# Patient Record
Sex: Female | Born: 2000 | Race: Black or African American | Hispanic: No | Marital: Single | Smoking: Never smoker
Health system: Southern US, Community
[De-identification: ages and names within clinical notes are randomized; demographics above are authoritative.]

---
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
## 2018-02-05 ENCOUNTER — Encounter (HOSPITAL_COMMUNITY): Payer: Self-pay | Admitting: Emergency Medicine

## 2018-02-05 ENCOUNTER — Ambulatory Visit (HOSPITAL_COMMUNITY)
Admission: EM | Admit: 2018-02-05 | Discharge: 2018-02-05 | Disposition: A | Discharge status: Home / Self Care | Payer: Medicaid Other | Attending: Family Medicine | Admitting: Family Medicine

## 2018-02-05 DIAGNOSIS — M546 Pain in thoracic spine: Secondary | ICD-10-CM | POA: Diagnosis not present

## 2018-02-05 DIAGNOSIS — M545 Low back pain, unspecified: Secondary | ICD-10-CM

## 2018-02-05 MED ORDER — NAPROXEN 500 MG PO TABS: 500.0000 mg | ORAL_TABLET | Freq: Two times a day (BID) | ORAL | 0 refills | Status: AC
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
## 2019-07-05 ENCOUNTER — Emergency Department (HOSPITAL_COMMUNITY)
Admission: EM | Admit: 2019-07-05 | Discharge: 2019-07-05 | Disposition: A | Discharge status: Home / Self Care | Payer: Medicaid Other | Attending: Emergency Medicine | Admitting: Emergency Medicine

## 2019-07-05 ENCOUNTER — Emergency Department (HOSPITAL_COMMUNITY): Payer: Medicaid Other

## 2019-07-05 ENCOUNTER — Encounter (HOSPITAL_COMMUNITY): Payer: Self-pay | Admitting: Emergency Medicine

## 2019-07-05 ENCOUNTER — Other Ambulatory Visit: Payer: Self-pay

## 2019-07-05 DIAGNOSIS — Y9289 Other specified places as the place of occurrence of the external cause: Secondary | ICD-10-CM | POA: Diagnosis not present

## 2019-07-05 DIAGNOSIS — S8392XA Sprain of unspecified site of left knee, initial encounter: Secondary | ICD-10-CM | POA: Diagnosis not present

## 2019-07-05 DIAGNOSIS — X58XXXA Exposure to other specified factors, initial encounter: Secondary | ICD-10-CM | POA: Diagnosis not present

## 2019-07-05 DIAGNOSIS — S8992XA Unspecified injury of left lower leg, initial encounter: Secondary | ICD-10-CM | POA: Diagnosis present

## 2019-07-05 DIAGNOSIS — Y9389 Activity, other specified: Secondary | ICD-10-CM | POA: Diagnosis not present

## 2019-07-05 DIAGNOSIS — S86912A Strain of unspecified muscle(s) and tendon(s) at lower leg level, left leg, initial encounter: Secondary | ICD-10-CM

## 2019-07-05 DIAGNOSIS — Z79899 Other long term (current) drug therapy: Secondary | ICD-10-CM | POA: Diagnosis not present

## 2019-07-05 DIAGNOSIS — Y999 Unspecified external cause status: Secondary | ICD-10-CM | POA: Insufficient documentation

## 2019-07-05 NOTE — Discharge Instructions (Signed)
Use a knee sleeve to help with compression.  You can also ice and complete the range of motion exercises attached. You can alternate Tylenol and ibuprofen to help with pain and swelling. Follow-up with orthopedist listed below for further evaluation. Return to the ED if you start to develop worsening swelling, redness or warmth of the joint, injuries or falls.

## 2019-07-05 NOTE — ED Provider Notes (Signed)
[facility] EMERGENCY DEPARTMENT Provider Note   CSN: [id number] Arrival date & time: 07/05/19  1009     History Chief Complaint  Patient presents with  . Knee Pain    Sara Hammond is a 19 y.o. female who presents to ED with a chief complaint of left knee pain.  Since yesterday started having aching knee pain with mild swelling associated with it.  History of similar symptoms several years ago which resolved after a few days.  She has not taken any medications to help with her pain or swelling.  Denies any injuries or falls, history of septic joint, fever, redness, prior fracture, dislocations or procedures in the area.  HPI     History reviewed. No pertinent past medical history.  There are no problems to display for this patient.   History reviewed. No pertinent surgical history.   OB History   No obstetric history on file.     No family history on file.  Social History   Tobacco Use  . Smoking status: Never Smoker  Substance Use Topics  . Alcohol use: No  . Drug use: Not on file    Home Medications Prior to Admission medications   Medication Sig Start Date End Date Taking? Authorizing Provider  medroxyPROGESTERone (DEPO-PROVERA) 150 MG/ML injection Inject 150 mg into the muscle every 3 (three) months.  01/07/17   [provider]    Allergies    Patient has no known allergies.  Review of Systems   Review of Systems  Constitutional: Negative for chills and fever.  Musculoskeletal: Positive for arthralgias and joint swelling. Negative for myalgias.  Skin: Negative for color change and wound.    Physical Exam Updated Vital Signs BP (!) 146/82 (BP Location: Right Arm)   Pulse 89   Temp 98.5 F (36.9 C) (Oral)   Resp 16   SpO2 99%   Physical Exam Vitals and nursing note reviewed.  Constitutional:      General: She is not in acute distress.    Appearance: She is well-developed. She is not diaphoretic.  HENT:     Head:  Normocephalic and atraumatic.  Eyes:     General: No scleral icterus.    Conjunctiva/sclera: Conjunctivae normal.  Pulmonary:     Effort: Pulmonary effort is normal. No respiratory distress.  Musculoskeletal:        General: Tenderness present.     Cervical back: Normal range of motion.     Comments: Diffuse TTP of left anterior knee with minimal associated edema.  No erythema, warmth or changes to range of motion noted. 2+ DP pulses plated bilaterally.  No calf tenderness or erythema/edema extending into the leg.  Ambulatory.  Skin:    Findings: No rash.  Neurological:     Mental Status: She is alert.     ED Results / Procedures / Treatments   Labs (all labs ordered are listed, but only abnormal results are displayed) Labs Reviewed - No data to display  EKG None  Radiology DG Knee Complete 4 Views Left  Result Date: 07/05/2019 CLINICAL DATA:  Left knee pain. EXAM: LEFT KNEE - COMPLETE 4+ VIEW COMPARISON:  None. FINDINGS: No evidence of fracture, dislocation, or joint effusion. No evidence of arthropathy or other focal bone abnormality. Soft tissues are unremarkable. IMPRESSION: Negative. Electronically Signed   By: Kathreen Devoid   On: 07/05/2019 10:56    Procedures Procedures (including critical care time)  Medications Ordered in ED Medications - No data  to display  ED Course  I have reviewed the triage vital signs and the nursing notes.  Pertinent labs & imaging results that were available during my care of the patient were reviewed by me and considered in my medical decision making (see chart for details).    MDM Rules/Calculators/A&P                      19 year old female presents to ED for left knee pain since yesterday.  No known injuries or trauma.  History of similar symptoms in the past which resolved with time.  There is some tenderness and edema noted on exam without erythema, warmth of joint or changes to range of motion.  Areas neurovascularly intact.   Sensation intact to light touch of extremity.  No calf tenderness.  X-ray is negative.  Will have her take Tylenol and ibuprofen as needed, give knee sleeve and instructions for rice therapy. Will give ortho f/u. She is ambulatory here without difficulty.  Patient is hemodynamically stable, in NAD, and able to ambulate in the ED. Evaluation does not show pathology that would require ongoing emergent intervention or inpatient treatment. I explained the diagnosis to the patient. Pain has been managed and has no complaints prior to discharge. Patient is comfortable with above plan and is stable for discharge at this time. All questions were answered prior to disposition. Strict return precautions for returning to the ED were discussed. Encouraged follow up with PCP.   An After Visit Summary was printed and given to the patient.   Portions of this note were generated with Scientist, clinical (histocompatibility and immunogenetics). Dictation errors may occur despite best attempts at proofreading.  Final Clinical Impression(s) / ED Diagnoses Final diagnoses:  Strain of left knee, initial encounter    Rx / DC Orders ED Discharge Orders    None       Dietrich Pates, PA-C 07/05/19 1127    Virgina Norfolk, DO 07/05/19 1610

## 2019-07-05 NOTE — ED Triage Notes (Signed)
Pt reports L knee pain without trauma or injury. She states that she had pain a while back but it went away on its own, now reports pain returned yesterday, worse with walking or standing. Reports some swelling to knee.

## 2019-07-25 ENCOUNTER — Emergency Department (HOSPITAL_COMMUNITY)
Admission: EM | Admit: 2019-07-25 | Discharge: 2019-07-26 | Disposition: A | Discharge status: Home / Self Care | Payer: Medicaid Other | Attending: Emergency Medicine | Admitting: Emergency Medicine

## 2019-07-25 ENCOUNTER — Other Ambulatory Visit: Payer: Self-pay

## 2019-07-25 ENCOUNTER — Encounter (HOSPITAL_COMMUNITY): Payer: Self-pay | Admitting: Emergency Medicine

## 2019-07-25 DIAGNOSIS — Z79899 Other long term (current) drug therapy: Secondary | ICD-10-CM | POA: Diagnosis not present

## 2019-07-25 DIAGNOSIS — R1030 Lower abdominal pain, unspecified: Secondary | ICD-10-CM | POA: Diagnosis not present

## 2019-07-25 DIAGNOSIS — N939 Abnormal uterine and vaginal bleeding, unspecified: Secondary | ICD-10-CM | POA: Insufficient documentation

## 2019-07-25 LAB — BASIC METABOLIC PANEL
Anion gap: 8 (ref 5–15)
BUN: 11 mg/dL (ref 6–20)
CO2: 22 mmol/L (ref 22–32)
Calcium: 9.1 mg/dL (ref 8.9–10.3)
Chloride: 107 mmol/L (ref 98–111)
Creatinine, Ser: 0.74 mg/dL (ref 0.44–1.00)
GFR calc Af Amer: >60 mL/min (ref >60)
GFR calc non Af Amer: >60 mL/min (ref >60)
Glucose, Bld: 91 mg/dL (ref 70–99)
Potassium: 3.5 mmol/L (ref 3.5–5.1)
Sodium: 137 mmol/L (ref 135–145)

## 2019-07-25 LAB — I-STAT BETA HCG BLOOD, ED (MC, WL, AP ONLY): I-stat hCG, quantitative: <5 m[IU]/mL (ref <5)

## 2019-07-25 LAB — CBC WITH DIFFERENTIAL/PLATELET
Abs Immature Granulocytes: 0.01 10*3/uL (ref 0.00–0.07)
Basophils Absolute: 0 10*3/uL (ref 0.0–0.1)
Basophils Relative: 0 %
Eosinophils Absolute: 0 10*3/uL (ref 0.0–0.5)
Eosinophils Relative: 0 %
HCT: 41.6 % (ref 36.0–46.0)
Hemoglobin: 13.6 g/dL (ref 12.0–15.0)
Immature Granulocytes: 0 %
Lymphocytes Relative: 49 %
Lymphs Abs: 2.9 10*3/uL (ref 0.7–4.0)
MCH: 29 pg (ref 26.0–34.0)
MCHC: 32.7 g/dL (ref 30.0–36.0)
MCV: 88.7 fL (ref 80.0–100.0)
Monocytes Absolute: 0.5 10*3/uL (ref 0.1–1.0)
Monocytes Relative: 8 %
Neutro Abs: 2.6 10*3/uL (ref 1.7–7.7)
Neutrophils Relative %: 43 %
Platelets: 183 10*3/uL (ref 150–400)
RBC: 4.69 MIL/uL (ref 3.87–5.11)
RDW: 12.7 % (ref 11.5–15.5)
WBC: 6 10*3/uL (ref 4.0–10.5)
nRBC: 0 % (ref 0.0–0.2)

## 2019-07-25 LAB — URINALYSIS, ROUTINE W REFLEX MICROSCOPIC
Bacteria, UA: NONE SEEN
Bilirubin Urine: NEGATIVE
Glucose, UA: NEGATIVE mg/dL
Ketones, ur: 80 mg/dL — AB
Leukocytes,Ua: NEGATIVE
Nitrite: NEGATIVE
Protein, ur: NEGATIVE mg/dL
Specific Gravity, Urine: 1.02 (ref 1.005–1.030)
pH: 5 (ref 5.0–8.0)

## 2019-07-25 NOTE — ED Triage Notes (Signed)
Pt states yesterday she got a positive preg test and today she got another one negative, she states she started having abd pain with cramps today and heavy vaginal bleed. Will like to be check.

## 2019-07-26 MED ORDER — CEFTRIAXONE SODIUM 500 MG IJ SOLR
250.0000 mg | Freq: Once | INTRAMUSCULAR | Status: AC
  Administered 2019-07-26: 01:00:00 250 mg via INTRAMUSCULAR
  Filled 2019-07-26: qty 500

## 2019-07-26 MED ORDER — AZITHROMYCIN 250 MG PO TABS
1000.0000 mg | ORAL_TABLET | Freq: Once | ORAL | Status: AC
  Administered 2019-07-26: 01:00:00 1000 mg via ORAL
  Filled 2019-07-26: qty 4

## 2019-07-26 MED ORDER — IBUPROFEN 800 MG PO TABS
800.0000 mg | ORAL_TABLET | Freq: Once | ORAL | Status: AC
  Administered 2019-07-26: 01:00:00 800 mg via ORAL
  Filled 2019-07-26: qty 1

## 2019-07-26 MED ORDER — STERILE WATER FOR INJECTION IJ SOLN
INTRAMUSCULAR | Status: AC
  Administered 2019-07-26: 01:00:00 10 mL
  Filled 2019-07-26: qty 10

## 2019-07-26 NOTE — ED Notes (Signed)
Patient verbalizes understanding of discharge instructions. Opportunity for questioning and answers were provided.All questions answered completely.  Armband removed by staff, pt discharged from ED. Ambulatory with strong, steady gait 

## 2019-07-26 NOTE — ED Notes (Addendum)
Pt to [facility] from [facility]. PT A&OX4, in NAD, placed on continuous pulse ox and BP cuff. VSS. Pt reports she took a positive home pregnancy test yesterday. Reports her period was two weeks late. Pt reports she had a doctor's appointment this morning to receive her depo shot. States she had a negative pregnancy test at her MDs office. Pt reports she went to work when she started experiencing vaginal bleeding and abdominal cramping. Pt reports changing pads three times today. MD at bedside

## 2019-07-26 NOTE — Discharge Instructions (Addendum)
Pregnancy test was negative, all other labs were within normal limits. You have had the treatment for STDs.  Abstain from sex for at least 1 week to let it take effect. Take the prescribed medication as directed to help with cramping.  Can also use heating pad or warm compress. Follow-up with your OB-GYN as scheduled in the next few weeks. Return to the ED for new or worsening symptoms.

## 2019-07-26 NOTE — ED Notes (Signed)
Ibuprofen given per MAR. Name/DOB verified with pt 

## 2019-07-26 NOTE — ED Provider Notes (Signed)
[facility] EMERGENCY DEPARTMENT Provider Note   CSN: [id number] Arrival date & time: 07/25/19  2001     History Chief Complaint  Patient presents with  . Vaginal Bleeding    Sara Hammond is a 19 y.o. female.  The history is provided by the patient and medical records.    19 year old female with no significant past medical history presenting to the ED for vaginal bleeding.  States her menstrual cycle was due 2 weeks ago, to started having bleeding today.  Given the late cycle she was concerned of pregnancy so took 2 home pregnancy test, one was positive and the second was negative.  She was actually at her OB/GYN office this morning to get her Depo shot, but did not speak to her physician about this.  States she just wants to be "checked out".  She does report some lower abdominal cramping, feels like menstrual cramps but is slightly more intense.  She took 2 Motrin tablets around 2:30 PM today but has not tried any other medications since that time.  History reviewed. No pertinent past medical history.  There are no problems to display for this patient.   History reviewed. No pertinent surgical history.   OB History   No obstetric history on file.     No family history on file.  Social History   Tobacco Use  . Smoking status: Never Smoker  . Smokeless tobacco: Never Used  Substance Use Topics  . Alcohol use: No  . Drug use: Not on file    Home Medications Prior to Admission medications   Medication Sig Start Date End Date Taking? Authorizing Provider  medroxyPROGESTERone (DEPO-PROVERA) 150 MG/ML injection Inject 150 mg into the muscle every 3 (three) months.  01/07/17   [provider]    Allergies    Patient has no known allergies.  Review of Systems   Review of Systems  Genitourinary: Positive for vaginal bleeding.  All other systems reviewed and are negative.   Physical Exam Updated Vital Signs BP 117/88 (BP Location: Right  Arm)   Pulse 96   Temp 98.6 F (37 C) (Oral)   Resp 18   Ht 5\' 1"  (1.549 m)   Wt 69.9 kg   SpO2 100%   BMI 29.10 kg/m   Physical Exam Vitals and nursing note reviewed.  Constitutional:      Appearance: She is well-developed.  HENT:     Head: Normocephalic and atraumatic.  Eyes:     Conjunctiva/sclera: Conjunctivae normal.     Pupils: Pupils are equal, round, and reactive to light.  Cardiovascular:     Rate and Rhythm: Normal rate and regular rhythm.     Heart sounds: Normal heart sounds.  Pulmonary:     Effort: Pulmonary effort is normal. No respiratory distress.     Breath sounds: Normal breath sounds. No rhonchi.  Abdominal:     General: Bowel sounds are normal.     Palpations: Abdomen is soft.     Tenderness: There is no abdominal tenderness. There is no guarding.     Hernia: No hernia is present.     Comments: Soft, non-tender, no peritoneal signs  Musculoskeletal:        General: Normal range of motion.     Cervical back: Normal range of motion.  Skin:    General: Skin is warm and dry.  Neurological:     Mental Status: She is alert and oriented to person, place, and time.  ED Results / Procedures / Treatments   Labs (all labs ordered are listed, but only abnormal results are displayed) Labs Reviewed  URINALYSIS, ROUTINE W REFLEX MICROSCOPIC - Abnormal; Notable for the following components:      Result Value   APPearance HAZY (*)    Hgb urine dipstick LARGE (*)    Ketones, ur 80 (*)    All other components within normal limits  CBC WITH DIFFERENTIAL/PLATELET  BASIC METABOLIC PANEL  I-STAT BETA HCG BLOOD, ED ([facility], [facility], [facility] ONLY)    EKG None  Radiology No results found.  Procedures Procedures (including critical care time)  Medications Ordered in ED Medications - No data to display  ED Course  I have reviewed the triage vital signs and the nursing notes.  Pertinent labs & imaging results that were available during my care of the patient were  reviewed by me and considered in my medical decision making (see chart for details).    MDM Rules/Calculators/A&P  19 year old female presenting to the ED with vaginal bleeding.  She was concerned for possible pregnancy as her menstrual cycle was due 2 weeks ago.  She was actually seen by her OB/GYN today and given Depo injection but did not speak to her doctor about this.  She reports continued cramping of the lower abdomen, feels like menstrual cramping but more intense.  She is afebrile and nontoxic.  Abdomen is soft and benign.  Denies any irregular discharge or pelvic pain.  Pregnancy test negative, labs otherwise reassuring.  Symptoms today likely due to onset of her menses, unclear why it was late.  Per chart review, patient was supposed to pick up medications today for treatment of chlamydia that was diagnosed at prior GYN visit.  States she did go to the pharmacy but they told her "they did not have it".  Will give rocephin/azithromycin here.  Given lack of pelvic pain, fever, or leukocytosis here, doubt PID (she was actually unaware she had chlaymdia)  She has follow-up with GYN scheduled in 2 weeks for re-check.  Continue motrin 800mg  TID, heating pad, warm compress, etc for cramping.  She may return here for any new/acute changes.  Final Clinical Impression(s) / ED Diagnoses Final diagnoses:  Vaginal bleeding    Rx / DC Orders ED Discharge Orders    None       Larene Pickett, PA-C 07/26/19 0131    Orpah Greek, MD 07/26/19 [phone]

## 2019-07-26 NOTE — ED Notes (Signed)
abx given per [facility]. Name/DOB verified with pt. Pt remains resting on cart in NAD. VSS, breathing easy, non-labored

## 2020-06-20 IMAGING — CR DG KNEE COMPLETE 4+V*L*
4 series · 4 of 4 positions shown · non-contrast
Comparison: None.

CLINICAL DATA: Left knee pain.

EXAM:
LEFT KNEE - COMPLETE 4+ VIEW

[knee ap]
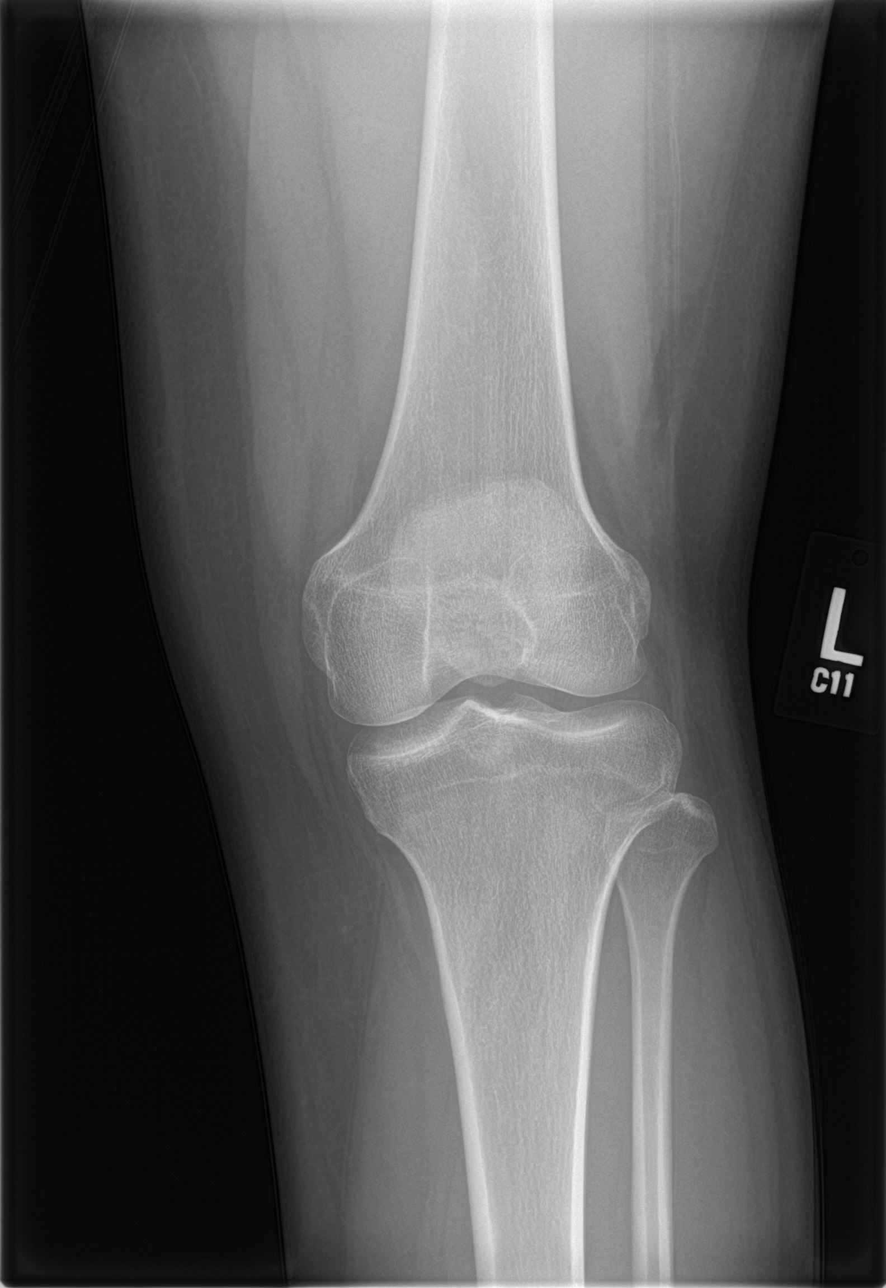

[knee lat]
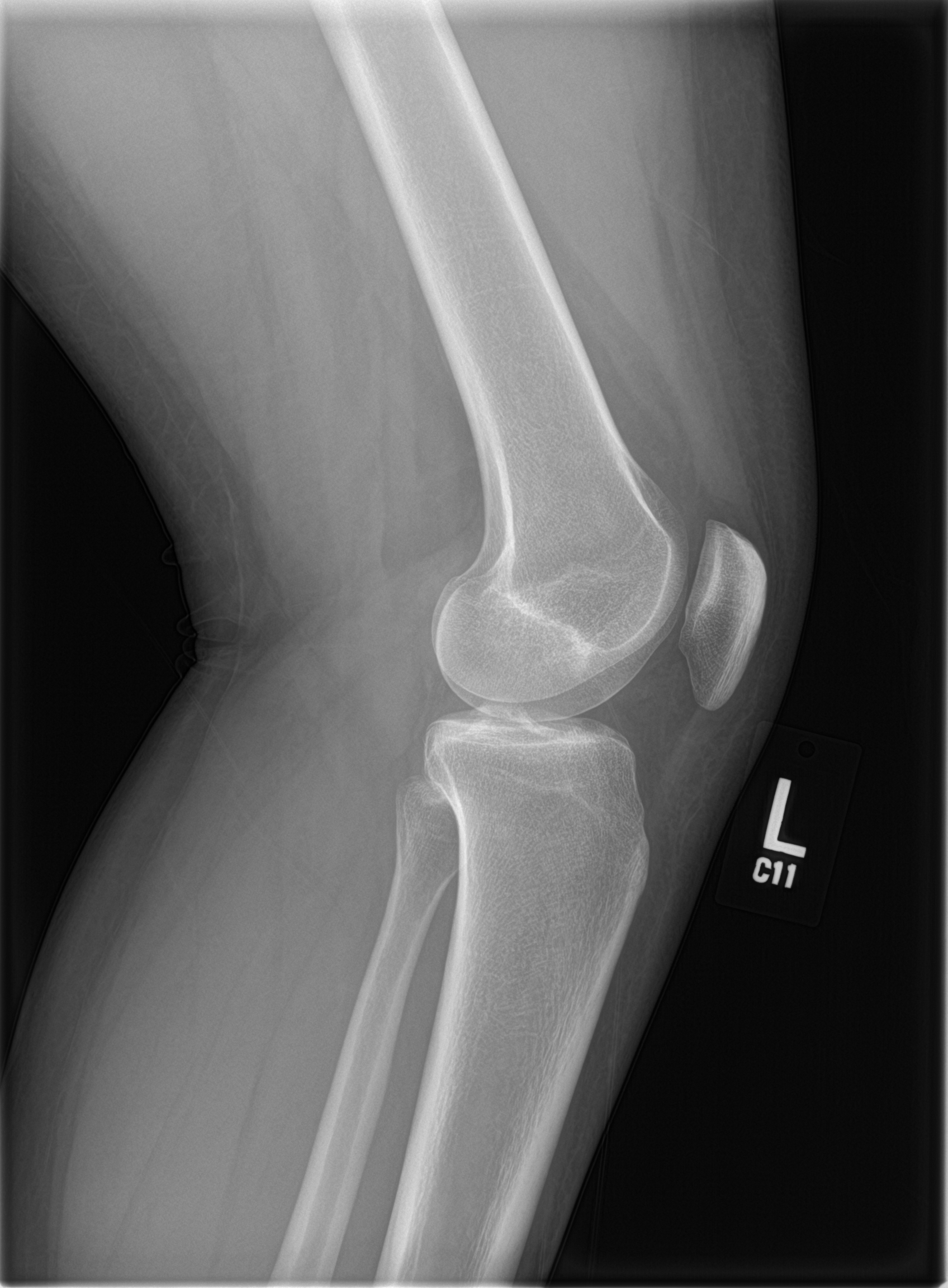

[knee obl (1 of 2)]
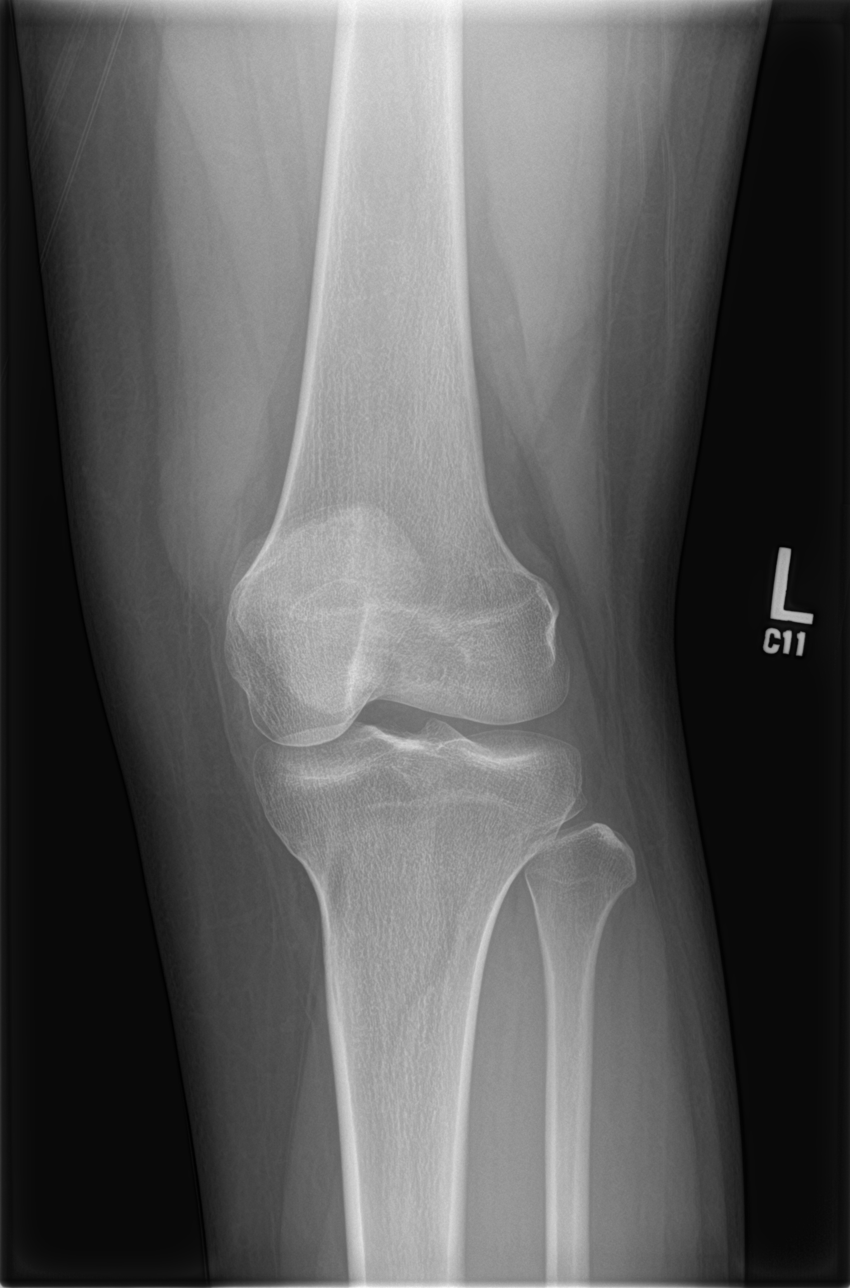

[knee obl (2 of 2)]
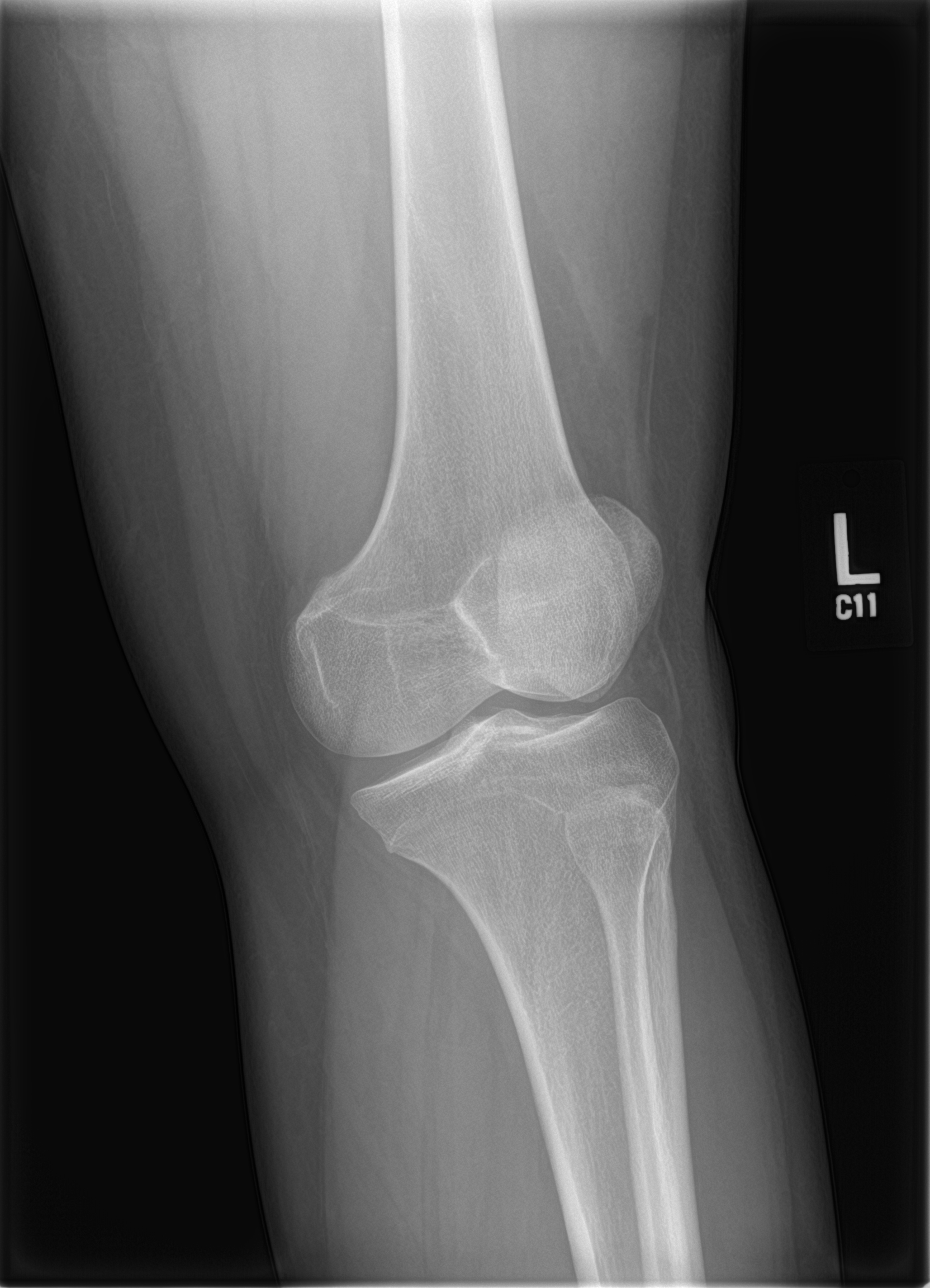

[4 of 4 positions shown; findings below may reference images not displayed]

FINDINGS: No evidence of fracture, dislocation, or joint effusion. No evidence
of arthropathy or other focal bone abnormality. Soft tissues are
unremarkable.
IMPRESSION: Negative.

## 2023-07-25 ENCOUNTER — Encounter (HOSPITAL_COMMUNITY): Payer: Self-pay | Admitting: *Deleted

## 2023-07-25 ENCOUNTER — Other Ambulatory Visit: Payer: Self-pay

## 2023-07-25 ENCOUNTER — Emergency Department (HOSPITAL_COMMUNITY)
Admission: EM | Admit: 2023-07-25 | Discharge: 2023-07-25 | Disposition: A | Discharge status: Home / Self Care | Payer: Medicaid Other | Attending: Emergency Medicine | Admitting: Emergency Medicine

## 2023-07-25 DIAGNOSIS — Z20822 Contact with and (suspected) exposure to covid-19: Secondary | ICD-10-CM | POA: Insufficient documentation

## 2023-07-25 DIAGNOSIS — J111 Influenza due to unidentified influenza virus with other respiratory manifestations: Secondary | ICD-10-CM | POA: Diagnosis not present

## 2023-07-25 DIAGNOSIS — R1033 Periumbilical pain: Secondary | ICD-10-CM | POA: Insufficient documentation

## 2023-07-25 DIAGNOSIS — R059 Cough, unspecified: Secondary | ICD-10-CM | POA: Diagnosis present

## 2023-07-25 LAB — COMPREHENSIVE METABOLIC PANEL
ALT: 18 U/L (ref 0–44)
AST: 27 U/L (ref 15–41)
Albumin: 3.7 g/dL (ref 3.5–5.0)
Alkaline Phosphatase: 76 U/L (ref 38–126)
Anion gap: 12 (ref 5–15)
BUN: 9 mg/dL (ref 6–20)
CO2: 24 mmol/L (ref 22–32)
Calcium: 8.8 mg/dL — ABNORMAL LOW (ref 8.9–10.3)
Chloride: 100 mmol/L (ref 98–111)
Creatinine, Ser: 0.85 mg/dL (ref 0.44–1.00)
GFR, Estimated: >60 mL/min (ref >60)
Glucose, Bld: 90 mg/dL (ref 70–99)
Potassium: 3.5 mmol/L (ref 3.5–5.1)
Sodium: 136 mmol/L (ref 135–145)
Total Bilirubin: 0.6 mg/dL (ref 0.0–1.2)
Total Protein: 7.9 g/dL (ref 6.5–8.1)

## 2023-07-25 LAB — RESP PANEL BY RT-PCR (RSV, FLU A&B, COVID)  RVPGX2
Influenza A by PCR: POSITIVE — AB
Influenza B by PCR: NEGATIVE
Resp Syncytial Virus by PCR: NEGATIVE
SARS Coronavirus 2 by RT PCR: NEGATIVE

## 2023-07-25 LAB — CBC
HCT: 41.2 % (ref 36.0–46.0)
Hemoglobin: 13.2 g/dL (ref 12.0–15.0)
MCH: 28.6 pg (ref 26.0–34.0)
MCHC: 32 g/dL (ref 30.0–36.0)
MCV: 89.2 fL (ref 80.0–100.0)
Platelets: 169 10*3/uL (ref 150–400)
RBC: 4.62 MIL/uL (ref 3.87–5.11)
RDW: 13.5 % (ref 11.5–15.5)
WBC: 4.2 10*3/uL (ref 4.0–10.5)
nRBC: 0 % (ref 0.0–0.2)

## 2023-07-25 LAB — HCG, SERUM, QUALITATIVE: Preg, Serum: NEGATIVE

## 2023-07-25 LAB — LIPASE, BLOOD: Lipase: 37 U/L (ref 11–51)

## 2023-07-25 MED ORDER — BENZONATATE 100 MG PO CAPS: 100.0000 mg | ORAL_CAPSULE | Freq: Three times a day (TID) | ORAL | 0 refills | Status: AC

## 2023-07-25 MED ORDER — ACETAMINOPHEN 325 MG PO TABS
650.0000 mg | ORAL_TABLET | Freq: Once | ORAL | Status: AC
  Administered 2023-07-25: 650 mg via ORAL
  Filled 2023-07-25: qty 2

## 2023-07-25 MED ORDER — OSELTAMIVIR PHOSPHATE 75 MG PO CAPS: 75.0000 mg | ORAL_CAPSULE | Freq: Two times a day (BID) | ORAL | 0 refills | Status: AC

## 2023-07-25 NOTE — ED Triage Notes (Signed)
PT states she has not been feeling well since Friday - body aches, abdominal pain, headaches, congestion and cough.

## 2023-07-25 NOTE — ED Provider Triage Note (Signed)
Emergency Medicine Provider Triage Evaluation Note  Sara Hammond , a 23 y.o. female  was evaluated in triage.  Pt complains of cough, congestion, abdominal pain. Symptoms since Friday. No n/v/d. Pain periumbilical. No history of previous. No fevers.  Review of Systems  Positive:  Negative:   Physical Exam  BP 139/79 (BP Location: Right Arm)   Pulse (!) 108   Temp 99.4 F (37.4 C) (Oral)   Resp 16   Ht 5\' 1"  (1.549 m)   Wt 69.9 kg   LMP 07/09/2023   SpO2 100%   BMI 29.12 kg/m  Gen:   Awake, no distress   Resp:  Normal effort  MSK:   Moves extremities without difficulty  Other:    Medical Decision Making  Medically screening exam initiated at 1:24 PM.  Appropriate orders placed.  Sara Hammond was informed that the remainder of the evaluation will be completed by another provider, this initial triage assessment does not replace that evaluation, and the importance of remaining in the ED until their evaluation is complete.     Silva Bandy, PA-C 07/25/23 1325

## 2023-07-25 NOTE — Discharge Instructions (Addendum)
As we discussed, you tested positive for influenza today which is likely the cause of your symptoms.  As this is a virus, no antibiotics are indicated.  I recommend that you get plenty of rest and focus on symptomatic relief which includes Cepacol throat lozenges for sore throat, Mucinex D (orange box) which you can get from behind the counter at your local pharmacy for congestion, and tylenol/ibuprofen as needed for fevers and bodyaches. I have also given you a prescription for Tessalon which is a cough suppressant medication and Tamiflu which she is a flu specific medication for you to take as prescribed for management of your symptoms. I also recommend:  Increased fluid intake. Sports drinks offer valuable electrolytes, sugars, and fluids.  Breathing heated mist or steam (vaporizer or shower).  Eating chicken soup or other clear broths, and maintaining good nutrition.   Increasing usage of your inhaler if you have asthma.  Return to work when your temperature has returned to normal.  Gargle warm salt water and spit it out for sore throat. Take benadryl or Zyrtec to decrease sinus secretions.  Follow Up: Follow up with your primary care doctor in 5-7 days for recheck of ongoing symptoms.  Return to emergency department for emergent changing or worsening of symptoms.

## 2023-07-25 NOTE — ED Provider Notes (Signed)
[place] EMERGENCY DEPARTMENT AT [facility] Provider Note   CSN: [id number] Arrival date & time: 07/25/23  1144     History  Chief Complaint  Patient presents with   Generalized Body Aches    OMER MONTER is a 23 y.o. female.  Patient with no pertinent past medical history presents today with complaints of generalized bodyaches, cough, congestion.  She states that her symptoms began initially on Friday afternoon and have been persistent since then.  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  She denies any fevers or chills.  Notes that she is also having abdominal pain in her periumbilical region.  This does not radiate.  No nausea, vomiting, or diarrhea.  No history of abdominal surgeries.  She has not tried anything for her symptoms.  LMP was mid January and was normal for her.  No urinary symptoms or vaginal discharge.  The history is provided by the patient. No language interpreter was used.       Home Medications Prior to Admission medications   Medication Sig Start Date End Date Taking? Authorizing Provider  medroxyPROGESTERone (DEPO-PROVERA) 150 MG/ML injection Inject 150 mg into the muscle every 3 (three) months.  01/07/17   [provider]      Allergies    Patient has no known allergies.    Review of Systems   Review of Systems  HENT:  Positive for congestion.   Respiratory:  Positive for cough.   Gastrointestinal:  Positive for abdominal pain.  All other systems reviewed and are negative.   Physical Exam Updated Vital Signs BP 139/79 (BP Location: Right Arm)   Pulse (!) 108   Temp 99.4 F (37.4 C) (Oral)   Resp 16   Ht 5\' 1"  (1.549 m)   Wt 69.9 kg   LMP 07/09/2023   SpO2 100%   BMI 29.12 kg/m  Physical Exam Vitals and nursing note reviewed.  Constitutional:      General: She is not in acute distress.    Appearance: Normal appearance. She is normal weight. She is not ill-appearing,  toxic-appearing or diaphoretic.     Comments: Resting comfortably in chair  HENT:     Head: Normocephalic and atraumatic.  Cardiovascular:     Rate and Rhythm: Normal rate and regular rhythm.     Heart sounds: Normal heart sounds.  Pulmonary:     Effort: Pulmonary effort is normal. No respiratory distress.     Breath sounds: Normal breath sounds.  Abdominal:     General: Abdomen is flat.     Palpations: Abdomen is soft.     Tenderness: There is no abdominal tenderness. There is no guarding or rebound.     Hernia: No hernia is present.  Musculoskeletal:        General: Normal range of motion.     Cervical back: Normal range of motion.  Skin:    General: Skin is warm and dry.  Neurological:     General: No focal deficit present.     Mental Status: She is alert.  Psychiatric:        Mood and Affect: Mood normal.        Behavior: Behavior normal.     ED Results / Procedures / Treatments   Labs (all labs ordered are listed, but only abnormal results are displayed) Labs Reviewed  RESP PANEL BY RT-PCR (RSV, FLU A&B, COVID)  RVPGX2 - Abnormal; Notable for the  following components:      Result Value   Influenza A by PCR POSITIVE (*)    All other components within normal limits  COMPREHENSIVE METABOLIC PANEL - Abnormal; Notable for the following components:   Calcium 8.8 (*)    All other components within normal limits  LIPASE, BLOOD  CBC  HCG, SERUM, QUALITATIVE    EKG None  Radiology No results found.  Procedures Procedures    Medications Ordered in ED Medications  acetaminophen (TYLENOL) tablet 650 mg (has no administration in time range)    ED Course/ Medical Decision Making/ A&P                                 Medical Decision Making Amount and/or Complexity of Data Reviewed Labs: ordered.  Risk OTC drugs.   This patient is a 23 y.o. female who presents to the ED for concern of cough, congestion, bodyaches, abdominal pain, this involves an extensive  number of treatment options, and is a complaint that carries with it a high risk of complications and morbidity. The emergent differential diagnosis prior to evaluation includes, but is not limited to,  URI, gastroenteritis, appendicitis, Gastroparesis, DKA, Hernia, Inflammatory bowel disease, pancreatitis. This is not an exhaustive differential.   Past Medical History / Co-morbidities / Social History:  has no past medical history on file.  Additional history: Chart reviewed.  Physical Exam: Physical exam performed. The pertinent findings include: Patient resting comfortably in the chair, her abdomen is soft and nontender.  Lung sounds CTA in all fields.  Lab Tests: I ordered, and personally interpreted labs.  The pertinent results include:  Flu positive.  No other acute laboratory abnormalities.   Imaging Studies: Considered imaging including chest x-ray and abdominal imaging, however patient's physical exam is benign and therefore shared decision making, patient would prefer to defer imaging at this time.   Medications: I ordered medication including tylenol  for myalgias. Reevaluation of the patient after these medicines showed that the patient improved. I have reviewed the patients home medicines and have made adjustments as needed.   Disposition: After consideration of the diagnostic results and the patients response to treatment, I feel that emergency department workup does not suggest an emergent condition requiring admission or immediate intervention beyond what has been performed at this time. The plan is: Discharge with Tamiflu and Tessalon for cough with close outpatient follow-up and return precautions.  Patient's symptoms likely consistent with influenza, discussed same with patient is understanding and in agreement with this. Patient is nontoxic, nonseptic appearing, in no apparent distress.  Patient's pain and other symptoms adequately managed in emergency department. Patient  does not meet the SIRS or Sepsis criteria.  On repeat exam patient does not have a surgical abdomin and there are no peritoneal signs.  No indication of appendicitis, bowel obstruction, bowel perforation, cholecystitis, diverticulitis, PID or ectopic pregnancy.  Evaluation and diagnostic testing in the emergency department does not suggest an emergent condition requiring admission or immediate intervention beyond what has been performed at this time.  Plan for discharge with close PCP follow-up.  Patient is understanding and amenable with plan, educated on red flag symptoms that would prompt immediate return.  Patient discharged in stable condition.  Final Clinical Impression(s) / ED Diagnoses Final diagnoses:  Influenza  Periumbilical abdominal pain    Rx / DC Orders ED Discharge Orders          Ordered  oseltamivir (TAMIFLU) 75 MG capsule  Every 12 hours        07/25/23 1533    benzonatate (TESSALON) 100 MG capsule  Every 8 hours        07/25/23 1533          An After Visit Summary was printed and given to the patient.     Vear Clock 07/25/23 1534    Gerhard Munch, MD 07/25/23 Izell Walker

## 2023-11-02 ENCOUNTER — Emergency Department (HOSPITAL_BASED_OUTPATIENT_CLINIC_OR_DEPARTMENT_OTHER)
Admission: EM | Admit: 2023-11-02 | Discharge: 2023-11-02 | Disposition: A | Discharge status: Home / Self Care | Attending: Emergency Medicine | Admitting: Emergency Medicine

## 2023-11-02 ENCOUNTER — Encounter (HOSPITAL_BASED_OUTPATIENT_CLINIC_OR_DEPARTMENT_OTHER): Payer: Self-pay | Admitting: Urology

## 2023-11-02 ENCOUNTER — Other Ambulatory Visit: Payer: Self-pay

## 2023-11-02 ENCOUNTER — Ambulatory Visit
Admission: EM | Admit: 2023-11-02 | Discharge: 2023-11-02 | Disposition: A | Discharge status: Home / Self Care | Attending: Family Medicine | Admitting: Family Medicine

## 2023-11-02 DIAGNOSIS — R112 Nausea with vomiting, unspecified: Secondary | ICD-10-CM

## 2023-11-02 DIAGNOSIS — R1114 Bilious vomiting: Secondary | ICD-10-CM | POA: Insufficient documentation

## 2023-11-02 DIAGNOSIS — R42 Dizziness and giddiness: Secondary | ICD-10-CM | POA: Diagnosis not present

## 2023-11-02 LAB — CBC
HCT: 37.8 % (ref 36.0–46.0)
Hemoglobin: 12.2 g/dL (ref 12.0–15.0)
MCH: 28.4 pg (ref 26.0–34.0)
MCHC: 32.3 g/dL (ref 30.0–36.0)
MCV: 87.9 fL (ref 80.0–100.0)
Platelets: 229 10*3/uL (ref 150–400)
RBC: 4.3 MIL/uL (ref 3.87–5.11)
RDW: 13.6 % (ref 11.5–15.5)
WBC: 7.6 10*3/uL (ref 4.0–10.5)
nRBC: 0 % (ref 0.0–0.2)

## 2023-11-02 LAB — COMPREHENSIVE METABOLIC PANEL WITH GFR
ALT: 10 U/L (ref 0–44)
AST: 18 U/L (ref 15–41)
Albumin: 4.3 g/dL (ref 3.5–5.0)
Alkaline Phosphatase: 102 U/L (ref 38–126)
Anion gap: 11 (ref 5–15)
BUN: 12 mg/dL (ref 6–20)
CO2: 26 mmol/L (ref 22–32)
Calcium: 9.5 mg/dL (ref 8.9–10.3)
Chloride: 99 mmol/L (ref 98–111)
Creatinine, Ser: 0.7 mg/dL (ref 0.44–1.00)
GFR, Estimated: >60 mL/min (ref >60)
Glucose, Bld: 129 mg/dL — ABNORMAL HIGH (ref 70–99)
Potassium: 3.4 mmol/L — ABNORMAL LOW (ref 3.5–5.1)
Sodium: 136 mmol/L (ref 135–145)
Total Bilirubin: <0.2 mg/dL (ref 0.0–1.2)
Total Protein: 7.7 g/dL (ref 6.5–8.1)

## 2023-11-02 LAB — POCT URINALYSIS DIP (MANUAL ENTRY)
Blood, UA: NEGATIVE
Glucose, UA: NEGATIVE mg/dL
Leukocytes, UA: NEGATIVE
Nitrite, UA: NEGATIVE
Protein Ur, POC: 100 mg/dL — AB
Spec Grav, UA: >=1.03 — AB (ref 1.010–1.025)
Urobilinogen, UA: 1 U/dL
pH, UA: 6 (ref 5.0–8.0)

## 2023-11-02 LAB — LIPASE, BLOOD: Lipase: 26 U/L (ref 11–51)

## 2023-11-02 LAB — POC COVID19/FLU A&B COMBO
Covid Antigen, POC: NEGATIVE
Influenza A Antigen, POC: NEGATIVE
Influenza B Antigen, POC: NEGATIVE

## 2023-11-02 LAB — POCT URINE PREGNANCY: Preg Test, Ur: NEGATIVE

## 2023-11-02 MED ORDER — SODIUM CHLORIDE 0.9 % IV BOLUS
500.0000 mL | Freq: Once | INTRAVENOUS | Status: AC
  Administered 2023-11-02: 500 mL via INTRAVENOUS

## 2023-11-02 MED ORDER — ACETAMINOPHEN 325 MG PO TABS
650.0000 mg | ORAL_TABLET | Freq: Once | ORAL | Status: AC
  Administered 2023-11-02: 650 mg via ORAL
  Filled 2023-11-02: qty 2

## 2023-11-02 MED ORDER — ONDANSETRON 4 MG PO TBDP
4.0000 mg | ORAL_TABLET | Freq: Once | ORAL | Status: AC
  Administered 2023-11-02: 4 mg via ORAL

## 2023-11-02 MED ORDER — ONDANSETRON HCL 4 MG/2ML IJ SOLN
4.0000 mg | Freq: Once | INTRAMUSCULAR | Status: AC
  Administered 2023-11-02: 4 mg via INTRAVENOUS
  Filled 2023-11-02: qty 2

## 2023-11-02 MED ORDER — ONDANSETRON HCL 4 MG PO TABS: 4.0000 mg | ORAL_TABLET | Freq: Four times a day (QID) | ORAL | 0 refills | Status: AC

## 2023-11-02 NOTE — ED Notes (Signed)
 Pt was seen by [facility] PTA and was given zofran Pt has been experiencing vomiting and dizziness x 2 days Sent here for further evaluation

## 2023-11-02 NOTE — Discharge Instructions (Signed)
 Please go to the ER for further evaluation of your symptoms

## 2023-11-02 NOTE — ED Notes (Signed)
 Pt tolerating liquids and snacks

## 2023-11-02 NOTE — ED Provider Notes (Signed)
 [place] [facility] Provider Note   CSN: [id number] Arrival date & time: 11/02/23  1850     History {Add pertinent medical, surgical, social history, OB history to HPI:1} Chief Complaint  Patient presents with  . Emesis    Sara Hammond is a 23 y.o. female.  Patient presents from urgent care with complaint of nausea and persistent vomiting that onset on Sunday. She endorses generalized abdominal discomfort that she describes as "sore" from vomiting. Unable to tolerate oral intake. She becomes dizzy when she sits or stands. Testing at [facility] was negative for COVID and Flu. Negative HCG.  The history is provided by the patient. No language interpreter was used.  Emesis Severity:  Moderate Duration:  3 days Timing:  Intermittent Quality:  Bilious material Progression:  Unchanged Chronicity:  New Associated symptoms: abdominal pain and headaches   Associated symptoms: no chills, no diarrhea and no fever        Home Medications Prior to Admission medications   Medication Sig Start Date End Date Taking? Authorizing Provider  benzonatate  (TESSALON ) 100 MG capsule Take 1 capsule (100 mg total) by mouth every 8 (eight) hours. 07/25/23   Smoot, Genevive Ket, PA-C  medroxyPROGESTERone (DEPO-PROVERA) 150 MG/ML injection Inject 150 mg into the muscle every 3 (three) months.  01/07/17   [provider]  oseltamivir  (TAMIFLU ) 75 MG capsule Take 1 capsule (75 mg total) by mouth every 12 (twelve) hours. 07/25/23   Smoot, Genevive Ket, PA-C      Allergies    Patient has no known allergies.    Review of Systems   Review of Systems  Constitutional:  Negative for chills and fever.  Gastrointestinal:  Positive for abdominal pain and vomiting. Negative for diarrhea.  Neurological:  Positive for headaches.  All other systems reviewed and are negative.   Physical Exam Updated Vital Signs BP (!) 124/99 (BP Location: Right Arm)   Pulse 88   Temp 98.2 F  (36.8 C) (Oral)   Resp 16   Ht 5\' 1"  (1.549 m)   Wt 69.9 kg   LMP 10/17/2023   SpO2 100%   BMI 29.12 kg/m  Physical Exam Constitutional:      Appearance: Normal appearance.  Eyes:     Conjunctiva/sclera: Conjunctivae normal.  Cardiovascular:     Rate and Rhythm: Normal rate.  Pulmonary:     Effort: Pulmonary effort is normal.  Abdominal:     General: There is no distension.     Palpations: Abdomen is soft.     Tenderness: There is no guarding.  Musculoskeletal:        General: Normal range of motion.     Cervical back: Normal range of motion.  Skin:    General: Skin is warm and dry.  Neurological:     Mental Status: She is alert and oriented to person, place, and time.  Psychiatric:        Mood and Affect: Mood normal.        Behavior: Behavior normal.    ED Results / Procedures / Treatments   Labs (all labs ordered are listed, but only abnormal results are displayed) Labs Reviewed  COMPREHENSIVE METABOLIC PANEL WITH GFR - Abnormal; Notable for the following components:      Result Value   Potassium 3.4 (*)    Glucose, Bld 129 (*)    All other components within normal limits  LIPASE, BLOOD  CBC    EKG None  Radiology No  results found.  Procedures Procedures  {Document cardiac monitor, telemetry assessment procedure when appropriate:1}  Medications Ordered in ED Medications  ondansetron (ZOFRAN) injection 4 mg (has no administration in time range)  sodium chloride 0.9 % bolus 500 mL (has no administration in time range)  acetaminophen  (TYLENOL ) tablet 650 mg (has no administration in time range)  sodium chloride 0.9 % bolus 500 mL (0 mLs Intravenous Stopped 11/02/23 2102)    ED Course/ Medical Decision Making/ A&P  No additional episodes of vomiting while in the ED. IV fluids given. Dizziness resolved. Tolerating oral fluids. {   Click here for ABCD2, HEART and other calculatorsREFRESH Note before signing :1}                              Medical  Decision Making Amount and/or Complexity of Data Reviewed Labs: ordered.  Risk OTC drugs. Prescription drug management.   ***  {Document critical care time when appropriate:1} {Document review of labs and clinical decision tools ie heart score, Chads2Vasc2 etc:1}  {Document your independent review of radiology images, and any outside records:1} {Document your discussion with family members, caretakers, and with consultants:1} {Document social determinants of health affecting pt's care:1} {Document your decision making why or why not admission, treatments were needed:1} Final Clinical Impression(s) / ED Diagnoses Final diagnoses:  None    Rx / DC Orders ED Discharge Orders     None

## 2023-11-02 NOTE — ED Notes (Signed)
 Took pt snacks and ginger ale. Comfort measures provided call light within reach and nurse notified.

## 2023-11-02 NOTE — ED Triage Notes (Signed)
 Pt c/o generalized abdominal pain and N/V, dizziness w/walkingx2d. Pt states unable to keep anything down.

## 2023-11-02 NOTE — ED Triage Notes (Addendum)
 Pt sent from UC for emesis and dizziness x 2 days  Unable to tolerate PO intake   Was given zofran for nausea at [facility]

## 2023-11-02 NOTE — ED Notes (Signed)
PO challenge given to pt 

## 2023-11-02 NOTE — ED Provider Notes (Signed)
 [facility]    CSN: [id number] Arrival date & time: 11/02/23  1538      History   Chief Complaint No chief complaint on file.   HPI Sara Hammond is a 23 y.o. female presents for nausea vomiting.  Patient reports 2 days of nausea with multiple episodes of nonbloody nonbilious vomiting.  States she has been dizzy with sitting up or walking.  She denies any fevers, URI symptoms such as cough/congestion, dysuria, diarrhea or constipation, headaches, chest pain, shortness of breath.  States she is only had 1 bottle of water  over the past 48 hours.  She has not had anything to eat.  Denies any GI diagnoses such as Crohn's, IBS, colitis, diverticulitis, GERD.  No recent travel or known sick contacts.  She has not taken any OTC medications for symptoms.  No other concerns at this time.  HPI  History reviewed. No pertinent past medical history.  There are no active problems to display for this patient.   History reviewed. No pertinent surgical history.  OB History   No obstetric history on file.      Home Medications    Prior to Admission medications   Medication Sig Start Date End Date Taking? Authorizing Provider  benzonatate  (TESSALON ) 100 MG capsule Take 1 capsule (100 mg total) by mouth every 8 (eight) hours. 07/25/23   Smoot, Genevive Ket, PA-C  medroxyPROGESTERone (DEPO-PROVERA) 150 MG/ML injection Inject 150 mg into the muscle every 3 (three) months.  01/07/17   [provider]  oseltamivir  (TAMIFLU ) 75 MG capsule Take 1 capsule (75 mg total) by mouth every 12 (twelve) hours. 07/25/23   Smoot, Genevive Ket, PA-C    Family History History reviewed. No pertinent family history.  Social History Social History   Tobacco Use   Smoking status: Never   Smokeless tobacco: Never  Substance Use Topics   Alcohol use: Yes    Comment: occ   Drug use: Never     Allergies   Patient has no known allergies.   Review of Systems Review of Systems  Constitutional:   Negative for fever.  Gastrointestinal:  Positive for abdominal pain, nausea and vomiting. Negative for constipation and diarrhea.  Neurological:  Positive for dizziness.     Physical Exam Triage Vital Signs ED Triage Vitals  Encounter Vitals Group     BP 11/02/23 1631 137/89     Systolic BP Percentile --      Diastolic BP Percentile --      Pulse Rate 11/02/23 1631 71     Resp 11/02/23 1631 16     Temp 11/02/23 1631 98 F (36.7 C)     Temp Source 11/02/23 1631 Oral     SpO2 11/02/23 1631 98 %     Weight --      Height --      Head Circumference --      Peak Flow --      Pain Score 11/02/23 1628 8     Pain Loc --      Pain Education --      Exclude from Growth Chart --    No data found.  Updated Vital Signs BP 137/89   Pulse 71   Temp 98 F (36.7 C) (Oral)   Resp 16   LMP 10/17/2023   SpO2 98%   Visual Acuity Right Eye Distance:   Left Eye Distance:   Bilateral Distance:    Right Eye Near:   Left Eye Near:  Bilateral Near:     Physical Exam Vitals and nursing note reviewed.  Constitutional:      General: She is not in acute distress.    Appearance: Normal appearance. She is not ill-appearing.     Comments: Patient laying on exam room chair with blanket covering her.  Does not want to sit up  HENT:     Head: Normocephalic and atraumatic.  Eyes:     Extraocular Movements: Extraocular movements intact.     Conjunctiva/sclera: Conjunctivae normal.     Pupils: Pupils are equal, round, and reactive to light.  Cardiovascular:     Rate and Rhythm: Normal rate and regular rhythm.     Heart sounds: Normal heart sounds.  Pulmonary:     Effort: Pulmonary effort is normal.     Breath sounds: Normal breath sounds.  Abdominal:     General: Bowel sounds are normal.     Palpations: Abdomen is soft. There is no hepatomegaly or splenomegaly.     Tenderness: There is generalized abdominal tenderness. Negative signs include Rovsing's sign and McBurney's sign.   Skin:    General: Skin is warm and dry.  Neurological:     General: No focal deficit present.     Mental Status: She is alert and oriented to person, place, and time.  Psychiatric:        Mood and Affect: Mood normal.        Behavior: Behavior normal.      UC Treatments / Results  Labs (all labs ordered are listed, but only abnormal results are displayed) Labs Reviewed  POCT URINALYSIS DIP (MANUAL ENTRY) - Abnormal; Notable for the following components:      Result Value   Clarity, UA cloudy (*)    Bilirubin, UA small (*)    Ketones, POC UA >= (160) (*)    Spec Grav, UA >=1.030 (*)    Protein Ur, POC =100 (*)    All other components within normal limits  POCT URINE PREGNANCY  POC COVID19/FLU A&B COMBO    EKG   Radiology No results found.  Procedures Procedures (including critical care time)  Medications Ordered in UC Medications  ondansetron (ZOFRAN-ODT) disintegrating tablet 4 mg (4 mg Oral Given 11/02/23 1733)    Initial Impression / Assessment and Plan / UC Course  I have reviewed the triage vital signs and the nursing notes.  Pertinent labs & imaging results that were available during my care of the patient were reviewed by me and considered in my medical decision making (see chart for details).     Reviewed exam and symptoms with patient.  Negative COVID and flu testing.  Urine shows dehydration but no signs of infection.  Negative hCG.  Discussed patient is likely dehydrated contributing to her dizziness.  Patient was given Zofran in clinic and encourage fluids.  She states she still feels incredibly nauseous and is very dizzy and wants to go to the hospital.  She states her boyfriend will drive her there and she declines EMS transfer at this time.  Patient will go POV to the emergency room. Final Clinical Impressions(s) / UC Diagnoses   Final diagnoses:  Nausea and vomiting, unspecified vomiting type  Dizziness     Discharge Instructions       Please go to the ER for further evaluation of your symptoms   ED Prescriptions   None    PDMP not reviewed this encounter.   Alleen Arbour, NP 11/02/23 1836

## 2023-11-02 NOTE — Discharge Instructions (Addendum)
 Please refer to the attached instructions. Zofran as directed for nausea and/or vomiting. Maintain good fluid intake. Follow-up with primary care provider as needed
